# Patient Record
Sex: Female | Born: 1950 | Race: Black or African American | Hispanic: No | Marital: Married | Smoking: Never smoker
Health system: Southern US, Community
[De-identification: ages and names within clinical notes are randomized; demographics above are authoritative.]

## PROBLEM LIST (undated history)

## (undated) DIAGNOSIS — C801 Malignant (primary) neoplasm, unspecified: Secondary | ICD-10-CM

## (undated) DIAGNOSIS — I10 Essential (primary) hypertension: Secondary | ICD-10-CM

## (undated) DIAGNOSIS — Z923 Personal history of irradiation: Secondary | ICD-10-CM

## (undated) HISTORY — DX: Essential (primary) hypertension: I10

## (undated) HISTORY — PX: BREAST LUMPECTOMY: SHX2

---

## 2010-11-09 DIAGNOSIS — Z923 Personal history of irradiation: Secondary | ICD-10-CM

## 2010-11-09 DIAGNOSIS — C801 Malignant (primary) neoplasm, unspecified: Secondary | ICD-10-CM

## 2010-11-09 HISTORY — PX: BREAST BIOPSY: SHX20

## 2010-11-09 HISTORY — DX: Malignant (primary) neoplasm, unspecified: C80.1

## 2010-11-09 HISTORY — DX: Personal history of irradiation: Z92.3

## 2010-12-31 ENCOUNTER — Ambulatory Visit: Payer: Self-pay | Admitting: Internal Medicine

## 2011-01-12 ENCOUNTER — Ambulatory Visit: Payer: Self-pay | Admitting: Internal Medicine

## 2011-01-29 ENCOUNTER — Ambulatory Visit: Payer: Self-pay | Admitting: Emergency Medicine

## 2011-01-30 ENCOUNTER — Ambulatory Visit: Payer: Self-pay | Admitting: Emergency Medicine

## 2011-02-09 ENCOUNTER — Ambulatory Visit: Payer: Self-pay | Admitting: Oncology

## 2011-02-16 LAB — PATHOLOGY REPORT

## 2011-03-10 ENCOUNTER — Ambulatory Visit: Payer: Self-pay | Admitting: Oncology

## 2011-03-27 ENCOUNTER — Ambulatory Visit: Payer: Self-pay | Admitting: Emergency Medicine

## 2011-04-10 ENCOUNTER — Ambulatory Visit: Payer: Self-pay | Admitting: Oncology

## 2011-05-10 ENCOUNTER — Ambulatory Visit: Payer: Self-pay | Admitting: Oncology

## 2011-06-10 ENCOUNTER — Ambulatory Visit: Payer: Self-pay | Admitting: Oncology

## 2011-09-17 ENCOUNTER — Ambulatory Visit: Payer: Self-pay | Admitting: Oncology

## 2011-10-10 ENCOUNTER — Ambulatory Visit: Payer: Self-pay | Admitting: Oncology

## 2011-11-10 ENCOUNTER — Ambulatory Visit: Payer: Self-pay | Admitting: Oncology

## 2012-04-19 ENCOUNTER — Ambulatory Visit: Payer: Self-pay | Admitting: Oncology

## 2012-04-19 LAB — COMPREHENSIVE METABOLIC PANEL
Albumin: 3.9 g/dL (ref 3.4–5.0)
Alkaline Phosphatase: 64 U/L (ref 50–136)
Anion Gap: 9 (ref 7–16)
BUN: 12 mg/dL (ref 7–18)
Bilirubin,Total: 0.2 mg/dL (ref 0.2–1.0)
Calcium, Total: 9.1 mg/dL (ref 8.5–10.1)
Chloride: 100 mmol/L (ref 98–107)
Co2: 30 mmol/L (ref 21–32)
Creatinine: 0.94 mg/dL (ref 0.60–1.30)
EGFR (African American): >60
EGFR (Non-African Amer.): >60
Glucose: 129 mg/dL — ABNORMAL HIGH (ref 65–99)
Osmolality: 279 (ref 275–301)
Potassium: 3.4 mmol/L — ABNORMAL LOW (ref 3.5–5.1)
SGOT(AST): 16 U/L (ref 15–37)
SGPT (ALT): 28 U/L
Sodium: 139 mmol/L (ref 136–145)
Total Protein: 8.9 g/dL — ABNORMAL HIGH (ref 6.4–8.2)

## 2012-04-19 LAB — CBC CANCER CENTER
Basophil #: 0 x10 3/mm (ref 0.0–0.1)
Basophil %: 0.3 %
Eosinophil #: 0.1 x10 3/mm (ref 0.0–0.7)
Eosinophil %: 0.9 %
HCT: 39 % (ref 35.0–47.0)
HGB: 12.3 g/dL (ref 12.0–16.0)
Lymphocyte #: 2 x10 3/mm (ref 1.0–3.6)
Lymphocyte %: 31.4 %
MCH: 26.2 pg (ref 26.0–34.0)
MCHC: 31.7 g/dL — ABNORMAL LOW (ref 32.0–36.0)
MCV: 83 fL (ref 80–100)
Monocyte #: 1 x10 3/mm — ABNORMAL HIGH (ref 0.2–0.9)
Monocyte %: 16.4 %
Neutrophil #: 3.2 x10 3/mm (ref 1.4–6.5)
Neutrophil %: 51 %
Platelet: 223 x10 3/mm (ref 150–440)
RBC: 4.71 10*6/uL (ref 3.80–5.20)
RDW: 14 % (ref 11.5–14.5)
WBC: 6.3 x10 3/mm (ref 3.6–11.0)

## 2012-05-09 ENCOUNTER — Ambulatory Visit: Payer: Self-pay | Admitting: Oncology

## 2012-10-18 ENCOUNTER — Ambulatory Visit: Payer: Self-pay | Admitting: Oncology

## 2012-10-18 LAB — COMPREHENSIVE METABOLIC PANEL
Albumin: 3.8 g/dL (ref 3.4–5.0)
Alkaline Phosphatase: 59 U/L (ref 50–136)
Anion Gap: 10 (ref 7–16)
BUN: 14 mg/dL (ref 7–18)
Bilirubin,Total: 0.2 mg/dL (ref 0.2–1.0)
Calcium, Total: 9.1 mg/dL (ref 8.5–10.1)
Chloride: 106 mmol/L (ref 98–107)
Co2: 27 mmol/L (ref 21–32)
Creatinine: 1.05 mg/dL (ref 0.60–1.30)
EGFR (African American): >60
EGFR (Non-African Amer.): 57 — ABNORMAL LOW
Glucose: 89 mg/dL (ref 65–99)
Osmolality: 285 (ref 275–301)
Potassium: 3.6 mmol/L (ref 3.5–5.1)
SGOT(AST): 17 U/L (ref 15–37)
SGPT (ALT): 28 U/L (ref 12–78)
Sodium: 143 mmol/L (ref 136–145)
Total Protein: 8.8 g/dL — ABNORMAL HIGH (ref 6.4–8.2)

## 2012-10-18 LAB — CBC CANCER CENTER
Basophil #: 0 x10 3/mm (ref 0.0–0.1)
Basophil %: 0.5 %
Eosinophil #: 0.1 x10 3/mm (ref 0.0–0.7)
Eosinophil %: 0.9 %
HCT: 38 % (ref 35.0–47.0)
HGB: 12.5 g/dL (ref 12.0–16.0)
Lymphocyte #: 2 x10 3/mm (ref 1.0–3.6)
Lymphocyte %: 34.9 %
MCH: 27 pg (ref 26.0–34.0)
MCHC: 33 g/dL (ref 32.0–36.0)
MCV: 82 fL (ref 80–100)
Monocyte #: 1 x10 3/mm — ABNORMAL HIGH (ref 0.2–0.9)
Monocyte %: 17.6 %
Neutrophil #: 2.6 x10 3/mm (ref 1.4–6.5)
Neutrophil %: 46.1 %
Platelet: 202 x10 3/mm (ref 150–440)
RBC: 4.65 10*6/uL (ref 3.80–5.20)
RDW: 13.5 % (ref 11.5–14.5)
WBC: 5.6 x10 3/mm (ref 3.6–11.0)

## 2012-11-09 ENCOUNTER — Ambulatory Visit: Payer: Self-pay | Admitting: Oncology

## 2013-04-09 ENCOUNTER — Ambulatory Visit: Payer: Self-pay | Admitting: Oncology

## 2013-04-13 ENCOUNTER — Ambulatory Visit: Payer: Self-pay | Admitting: Oncology

## 2013-04-18 LAB — COMPREHENSIVE METABOLIC PANEL
Albumin: 3.9 g/dL (ref 3.4–5.0)
Alkaline Phosphatase: 70 U/L (ref 50–136)
Anion Gap: 7 (ref 7–16)
BUN: 15 mg/dL (ref 7–18)
Bilirubin,Total: 0.2 mg/dL (ref 0.2–1.0)
Calcium, Total: 9.3 mg/dL (ref 8.5–10.1)
Chloride: 103 mmol/L (ref 98–107)
Co2: 30 mmol/L (ref 21–32)
Creatinine: 1.15 mg/dL (ref 0.60–1.30)
EGFR (African American): 59 — ABNORMAL LOW
EGFR (Non-African Amer.): 51 — ABNORMAL LOW
Glucose: 108 mg/dL — ABNORMAL HIGH (ref 65–99)
Osmolality: 281 (ref 275–301)
Potassium: 3.8 mmol/L (ref 3.5–5.1)
SGOT(AST): 14 U/L — ABNORMAL LOW (ref 15–37)
SGPT (ALT): 25 U/L (ref 12–78)
Sodium: 140 mmol/L (ref 136–145)
Total Protein: 8.9 g/dL — ABNORMAL HIGH (ref 6.4–8.2)

## 2013-04-18 LAB — CBC CANCER CENTER
Basophil #: 0.1 x10 3/mm (ref 0.0–0.1)
Basophil %: 0.9 %
Eosinophil #: 0 x10 3/mm (ref 0.0–0.7)
Eosinophil %: 0.5 %
HCT: 39.2 % (ref 35.0–47.0)
HGB: 13 g/dL (ref 12.0–16.0)
Lymphocyte #: 2 x10 3/mm (ref 1.0–3.6)
Lymphocyte %: 28.6 %
MCH: 27.1 pg (ref 26.0–34.0)
MCHC: 33.1 g/dL (ref 32.0–36.0)
MCV: 82 fL (ref 80–100)
Monocyte #: 0.9 x10 3/mm (ref 0.2–0.9)
Monocyte %: 13.4 %
Neutrophil #: 4 x10 3/mm (ref 1.4–6.5)
Neutrophil %: 56.6 %
Platelet: 218 x10 3/mm (ref 150–440)
RBC: 4.8 10*6/uL (ref 3.80–5.20)
RDW: 13.5 % (ref 11.5–14.5)
WBC: 7 x10 3/mm (ref 3.6–11.0)

## 2013-05-09 ENCOUNTER — Ambulatory Visit: Payer: Self-pay | Admitting: Oncology

## 2013-11-27 ENCOUNTER — Ambulatory Visit: Payer: Self-pay | Admitting: Oncology

## 2014-12-25 ENCOUNTER — Ambulatory Visit: Payer: Self-pay | Admitting: Oncology

## 2017-08-10 ENCOUNTER — Other Ambulatory Visit: Payer: Self-pay | Admitting: Internal Medicine

## 2017-08-10 DIAGNOSIS — C50012 Malignant neoplasm of nipple and areola, left female breast: Secondary | ICD-10-CM

## 2018-08-10 ENCOUNTER — Other Ambulatory Visit: Payer: Self-pay | Admitting: Family Medicine

## 2018-08-10 DIAGNOSIS — Z0000 Encounter for general adult medical examination without abnormal findings: Secondary | ICD-10-CM

## 2018-12-21 ENCOUNTER — Other Ambulatory Visit: Payer: Self-pay | Admitting: Family Medicine

## 2018-12-21 DIAGNOSIS — Z1231 Encounter for screening mammogram for malignant neoplasm of breast: Secondary | ICD-10-CM

## 2018-12-23 ENCOUNTER — Other Ambulatory Visit: Payer: Self-pay | Admitting: Family Medicine

## 2018-12-23 DIAGNOSIS — Z13820 Encounter for screening for osteoporosis: Secondary | ICD-10-CM

## 2019-01-17 ENCOUNTER — Encounter (HOSPITAL_COMMUNITY): Payer: Self-pay

## 2019-01-17 ENCOUNTER — Ambulatory Visit
Admission: RE | Admit: 2019-01-17 | Discharge: 2019-01-17 | Disposition: A | Discharge status: Home / Self Care | Payer: Medicare HMO | Source: Ambulatory Visit | Attending: Family Medicine | Admitting: Family Medicine

## 2019-01-17 DIAGNOSIS — Z13820 Encounter for screening for osteoporosis: Secondary | ICD-10-CM

## 2019-01-17 DIAGNOSIS — Z1231 Encounter for screening mammogram for malignant neoplasm of breast: Secondary | ICD-10-CM | POA: Diagnosis present

## 2019-01-17 HISTORY — DX: Malignant (primary) neoplasm, unspecified: C80.1

## 2019-01-17 HISTORY — DX: Personal history of irradiation: Z92.3

## 2019-12-18 ENCOUNTER — Other Ambulatory Visit: Payer: Self-pay | Admitting: Family Medicine

## 2019-12-18 DIAGNOSIS — Z1231 Encounter for screening mammogram for malignant neoplasm of breast: Secondary | ICD-10-CM

## 2020-02-21 ENCOUNTER — Ambulatory Visit
Admission: RE | Admit: 2020-02-21 | Discharge: 2020-02-21 | Disposition: A | Discharge status: Home / Self Care | Payer: Medicare HMO | Source: Ambulatory Visit | Attending: Family Medicine | Admitting: Family Medicine

## 2020-02-21 DIAGNOSIS — Z1231 Encounter for screening mammogram for malignant neoplasm of breast: Secondary | ICD-10-CM | POA: Insufficient documentation

## 2020-02-27 ENCOUNTER — Other Ambulatory Visit: Payer: Self-pay | Admitting: Family Medicine

## 2020-02-27 DIAGNOSIS — R921 Mammographic calcification found on diagnostic imaging of breast: Secondary | ICD-10-CM

## 2020-02-27 DIAGNOSIS — R928 Other abnormal and inconclusive findings on diagnostic imaging of breast: Secondary | ICD-10-CM

## 2020-03-08 ENCOUNTER — Ambulatory Visit
Admission: RE | Admit: 2020-03-08 | Discharge: 2020-03-08 | Disposition: A | Discharge status: Home / Self Care | Payer: Medicare HMO | Source: Ambulatory Visit | Attending: Family Medicine | Admitting: Family Medicine

## 2020-03-08 DIAGNOSIS — R921 Mammographic calcification found on diagnostic imaging of breast: Secondary | ICD-10-CM | POA: Diagnosis present

## 2020-03-08 DIAGNOSIS — R928 Other abnormal and inconclusive findings on diagnostic imaging of breast: Secondary | ICD-10-CM | POA: Insufficient documentation

## 2020-03-11 ENCOUNTER — Other Ambulatory Visit: Payer: Self-pay | Admitting: Family Medicine

## 2020-03-26 ENCOUNTER — Other Ambulatory Visit: Payer: Self-pay | Admitting: Family Medicine

## 2020-03-26 DIAGNOSIS — R921 Mammographic calcification found on diagnostic imaging of breast: Secondary | ICD-10-CM

## 2020-03-26 DIAGNOSIS — R928 Other abnormal and inconclusive findings on diagnostic imaging of breast: Secondary | ICD-10-CM

## 2020-03-29 ENCOUNTER — Ambulatory Visit
Admission: RE | Admit: 2020-03-29 | Discharge: 2020-03-29 | Disposition: A | Discharge status: Home / Self Care | Payer: Medicare HMO | Source: Ambulatory Visit | Attending: Family Medicine | Admitting: Family Medicine

## 2020-03-29 DIAGNOSIS — R921 Mammographic calcification found on diagnostic imaging of breast: Secondary | ICD-10-CM | POA: Diagnosis present

## 2020-03-29 DIAGNOSIS — R928 Other abnormal and inconclusive findings on diagnostic imaging of breast: Secondary | ICD-10-CM | POA: Diagnosis not present

## 2020-03-29 HISTORY — PX: BREAST BIOPSY: SHX20

## 2020-04-01 LAB — SURGICAL PATHOLOGY

## 2020-04-02 ENCOUNTER — Other Ambulatory Visit: Payer: Self-pay | Admitting: Family Medicine

## 2020-04-02 DIAGNOSIS — M5416 Radiculopathy, lumbar region: Secondary | ICD-10-CM

## 2020-04-16 ENCOUNTER — Other Ambulatory Visit: Payer: Self-pay

## 2020-04-16 ENCOUNTER — Ambulatory Visit
Admission: RE | Admit: 2020-04-16 | Discharge: 2020-04-16 | Disposition: A | Discharge status: Home / Self Care | Payer: Medicare HMO | Source: Ambulatory Visit | Attending: Family Medicine | Admitting: Family Medicine

## 2020-04-16 DIAGNOSIS — M5416 Radiculopathy, lumbar region: Secondary | ICD-10-CM | POA: Diagnosis present

## 2020-10-18 IMAGING — MR MR LUMBAR SPINE W/O CM
5 series · 31 of 48 positions shown · non-contrast
Comparison: None.

CLINICAL DATA: Bilateral hip pain.

EXAM:
MRI LUMBAR SPINE WITHOUT CONTRAST
TECHNIQUE: Multiplanar, multisequence MR imaging of the lumbar spine was
performed. No intravenous contrast was administered.

[Series 5: T2 · sagittal · 4.0mm · 0.81mm/px · 6 of 17 slices shown (1 of 2)]
[im 1/17]
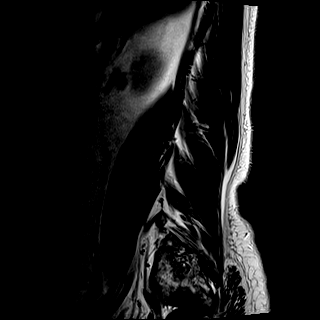
[im 4/17]
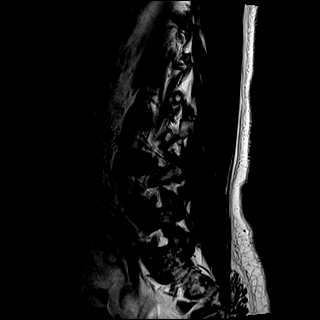
[im 7/17]
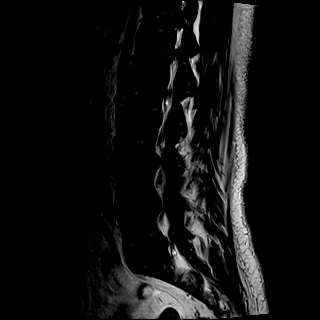
[im 10/17]
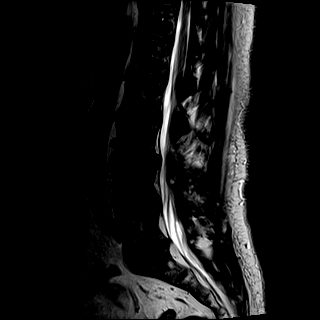
[im 13/17]
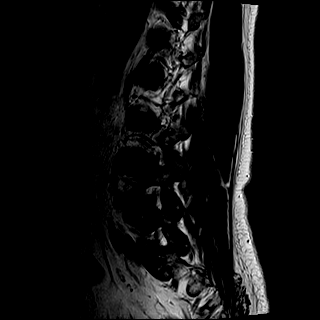
[im 17/17]
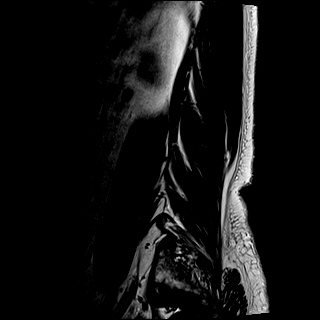

[Series 6: T1 · sagittal · 4.0mm · 0.81mm/px · 6 of 17 slices shown (1 of 2)]
[im 1/17]
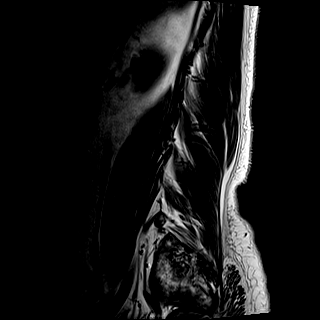
[im 4/17]
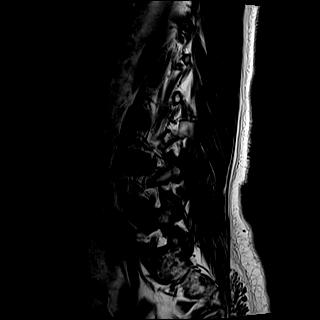
[im 7/17]
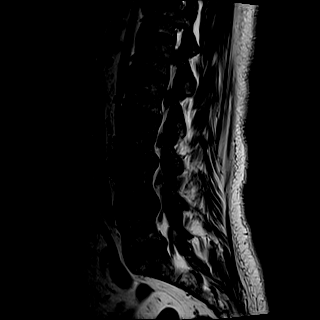
[im 10/17]
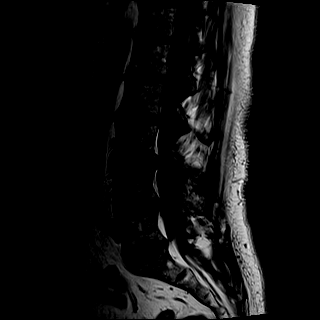
[im 13/17]
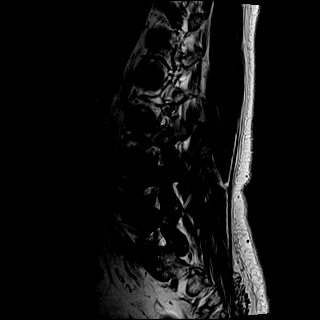
[im 17/17]
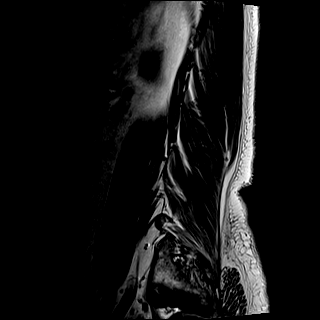

[Series 7: STIR · sagittal · 4.0mm · 0.41mm/px · 1 of 17 slices shown]
[im 1/17]
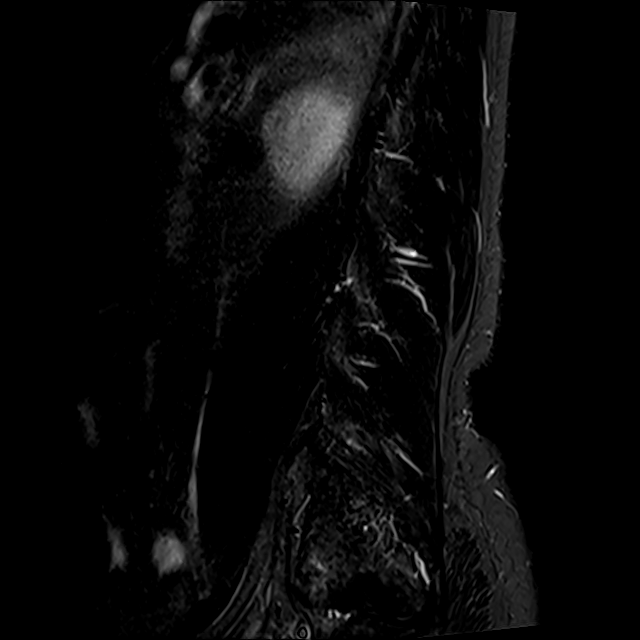

[Series 8: T2 · axial · 4.0mm · 0.78mm/px · z∈[-74,+147]mm · 9 of 39 slices shown (2 of 2)]
[im 1/39]
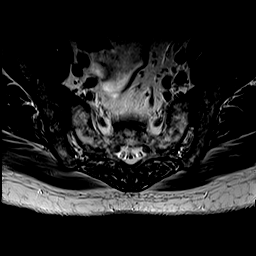
[im 6/39]
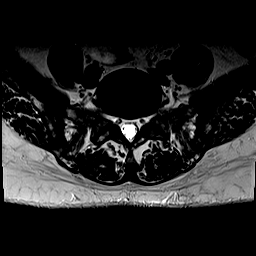
[im 11/39]
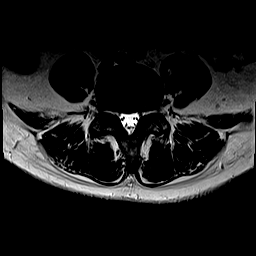
[im 17/39]
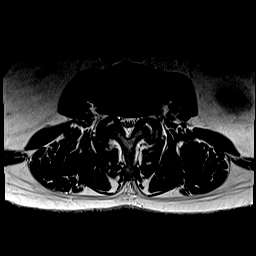
[im 20/39]
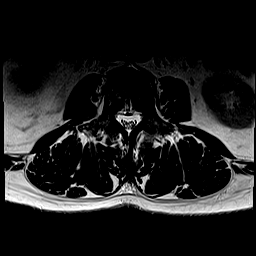
[im 22/39]
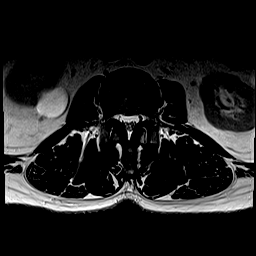
[im 28/39]
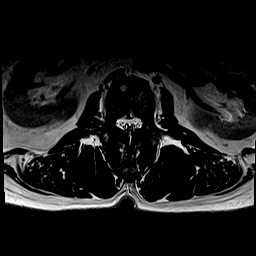
[im 33/39]
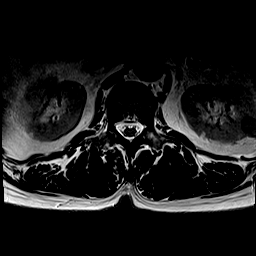
[im 39/39]
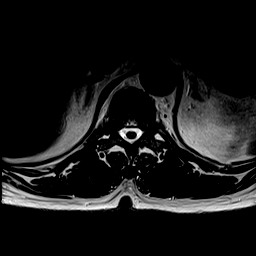

[Series 9: T1 · axial · 4.0mm · 0.39mm/px · z∈[-74,+147]mm · 9 of 39 slices shown (2 of 2)]
[im 1/39]
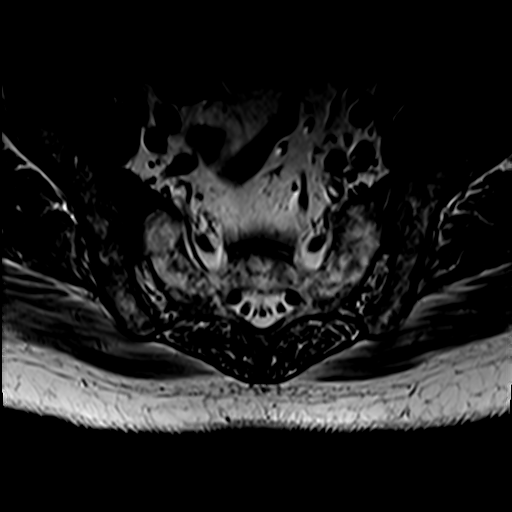
[im 6/39]
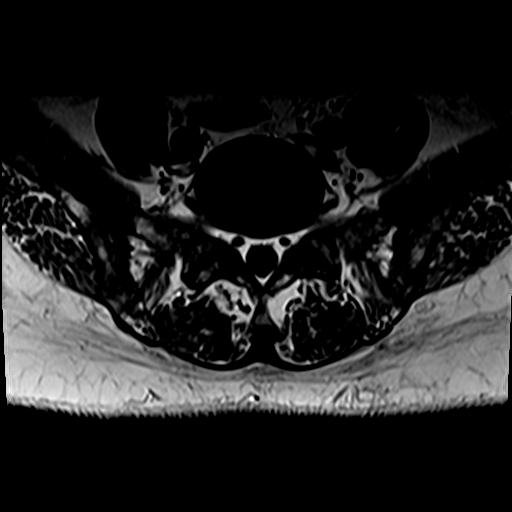
[im 11/39]
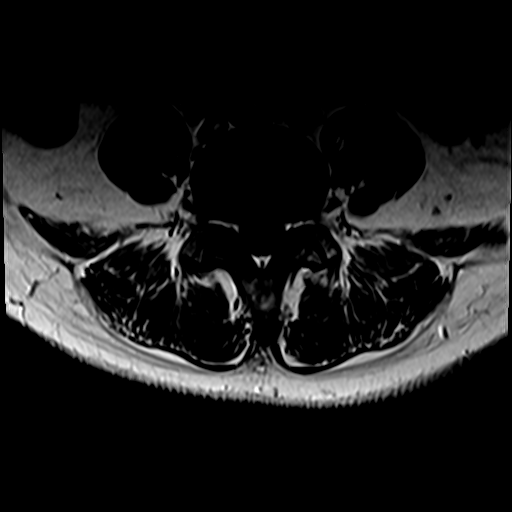
[im 17/39]
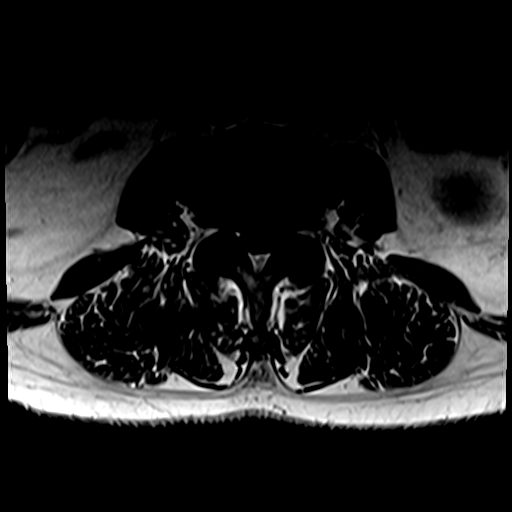
[im 20/39]
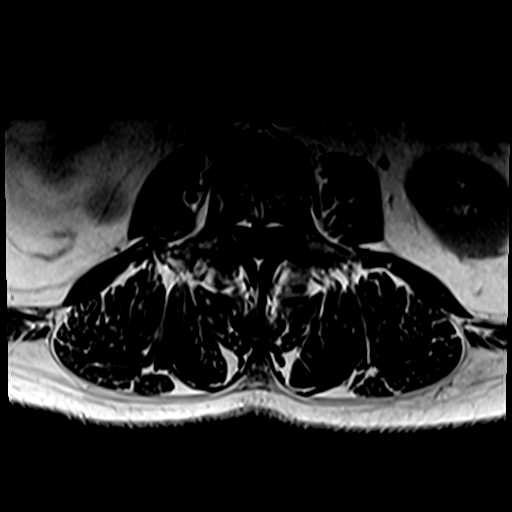
[im 22/39]
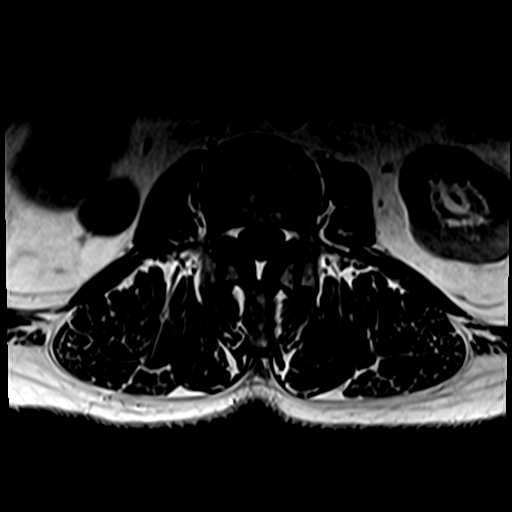
[im 28/39]
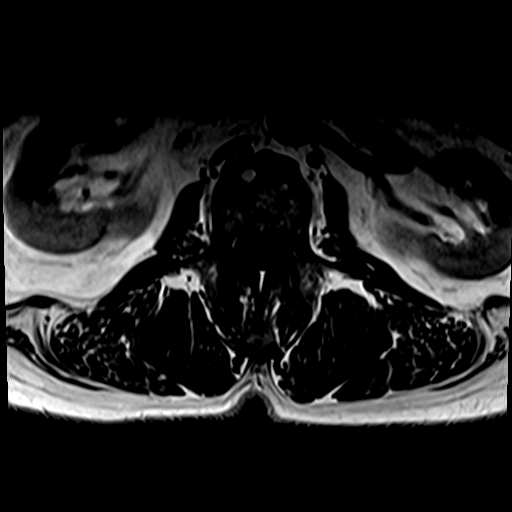
[im 33/39]
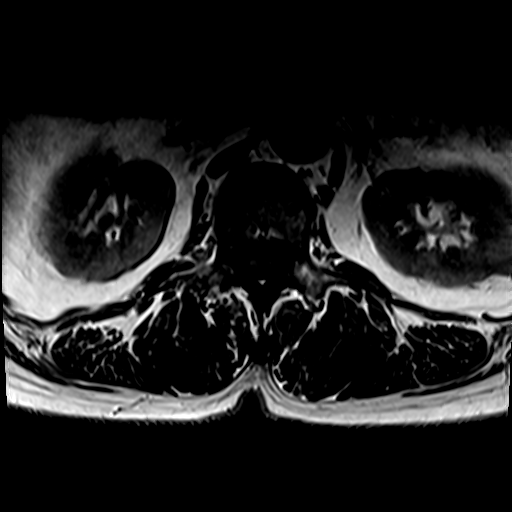
[im 39/39]
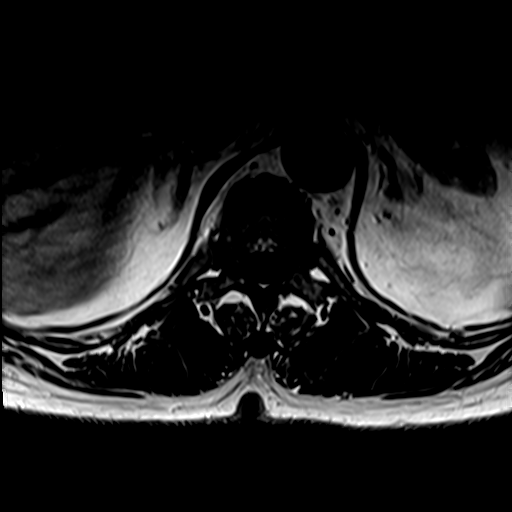

[31 of 48 positions shown; findings below may reference images not displayed]

FINDINGS: Segmentation:  Standard.

Alignment:  Physiologic.

Vertebrae: No fracture, evidence of discitis, or bone lesion.
Diffuse heterogeneous signal within the visualized bony structures,
predominantly hypointense on T1 without increased signal on STIR,
likely related to red marrow reconversion. Marrow edema is seen
about the posterior elements at L3-4 on the left, degenerative.

Conus medullaris and cauda equina: Conus extends to the L1-2 level.
Conus and cauda equina appear normal.

Paraspinal and other soft tissues: Bilateral renal cysts with a T2
hypointense lesion only partially seen on the axial (series 8, image
8). Correlation with dedicated study suggested.

Disc levels:

T12-L1: No spinal canal or neural foraminal stenosis.

L1-2: No spinal canal or neural foraminal stenosis.

L2-3: No spinal canal or neural foraminal stenosis.

L3-4: Loss of disc height, shallow disc bulge, facet degenerative
changes and ligamentum flavum redundancy resulting in mild bilateral
narrowing of the subarticular zones and mild bilateral neural
foraminal stenosis.

L4-5: Shallow disc bulge, mild facet degenerative changes and
ligamentum flavum redundancy resulting in mild narrowing of the
bilateral subarticular zones and mild bilateral neural foraminal
narrowing.

L5-S1: Tiny posterior disc protrusion mild facet degenerative
changes without significant spinal canal or neural foraminal
stenosis
IMPRESSION: 1. Mild degenerative changes of the lower lumbar spine with mild
bilateral neural foraminal narrowing and mild narrowing of the
bilateral subarticular zones at L3-4 and L4-5. No high-grade spinal
canal or neural foraminal narrowing.
2. Diffuse heterogeneous signal within the visualized bony
structures, predominantly hypointense on T1 without increased signal
on STIR, likely related to red marrow reconversion.
3. Marrow edema is seen about the posterior elements at L3-4 on the
left, degenerative.

## 2021-04-16 ENCOUNTER — Other Ambulatory Visit: Payer: Self-pay | Admitting: *Deleted

## 2021-04-16 ENCOUNTER — Other Ambulatory Visit: Payer: Self-pay | Admitting: Physician Assistant

## 2021-04-16 DIAGNOSIS — Z1231 Encounter for screening mammogram for malignant neoplasm of breast: Secondary | ICD-10-CM

## 2021-04-23 ENCOUNTER — Ambulatory Visit
Admission: RE | Admit: 2021-04-23 | Discharge: 2021-04-23 | Disposition: A | Discharge status: Home / Self Care | Payer: Medicare HMO | Source: Ambulatory Visit | Attending: Physician Assistant | Admitting: Physician Assistant

## 2021-04-23 ENCOUNTER — Other Ambulatory Visit: Payer: Self-pay

## 2021-04-23 DIAGNOSIS — Z1231 Encounter for screening mammogram for malignant neoplasm of breast: Secondary | ICD-10-CM | POA: Diagnosis present

## 2022-01-19 ENCOUNTER — Other Ambulatory Visit: Payer: Self-pay | Admitting: Physician Assistant

## 2022-01-19 DIAGNOSIS — Z13820 Encounter for screening for osteoporosis: Secondary | ICD-10-CM

## 2022-01-19 DIAGNOSIS — Z1231 Encounter for screening mammogram for malignant neoplasm of breast: Secondary | ICD-10-CM

## 2022-05-21 ENCOUNTER — Ambulatory Visit
Admission: RE | Admit: 2022-05-21 | Discharge: 2022-05-21 | Disposition: A | Discharge status: Home / Self Care | Payer: Medicare HMO | Source: Ambulatory Visit | Attending: Physician Assistant | Admitting: Physician Assistant

## 2022-05-21 DIAGNOSIS — Z923 Personal history of irradiation: Secondary | ICD-10-CM | POA: Diagnosis not present

## 2022-05-21 DIAGNOSIS — Z853 Personal history of malignant neoplasm of breast: Secondary | ICD-10-CM | POA: Diagnosis not present

## 2022-05-21 DIAGNOSIS — Z1231 Encounter for screening mammogram for malignant neoplasm of breast: Secondary | ICD-10-CM | POA: Insufficient documentation

## 2022-05-21 DIAGNOSIS — Z780 Asymptomatic menopausal state: Secondary | ICD-10-CM | POA: Insufficient documentation

## 2022-05-21 DIAGNOSIS — Z13820 Encounter for screening for osteoporosis: Secondary | ICD-10-CM | POA: Diagnosis present

## 2023-04-13 ENCOUNTER — Other Ambulatory Visit: Payer: Self-pay | Admitting: Nurse Practitioner

## 2023-04-13 DIAGNOSIS — Z1231 Encounter for screening mammogram for malignant neoplasm of breast: Secondary | ICD-10-CM

## 2023-05-24 ENCOUNTER — Ambulatory Visit
Admission: RE | Admit: 2023-05-24 | Discharge: 2023-05-24 | Disposition: A | Discharge status: Home / Self Care | Payer: Medicare HMO | Source: Ambulatory Visit | Attending: Nurse Practitioner | Admitting: Nurse Practitioner

## 2023-05-24 DIAGNOSIS — Z1231 Encounter for screening mammogram for malignant neoplasm of breast: Secondary | ICD-10-CM | POA: Insufficient documentation

## 2023-05-24 NOTE — Progress Notes (Signed)
Celso Amy, PA-C [address]  [address]  [place], [place] [address]  Main: [phone]  Fax: [phone]   Gastroenterology Consultation  Referring Provider:     Dudley Major, FNP Primary Care Physician:  Dudley Major, FNP Primary Gastroenterologist:  Celso Amy, PA-C / Dr. Lannette Donath   Reason for Consultation:     Positive Cologuard        HPI:   Ariana Potter is a 72 y.o. y/o female referred for consultation & management  by Dudley Major, FNP.    Patient had annual wellness exam 04/12/2023.  She had a positive screening Cologuard test.  Patient states she had a normal colonoscopy around 2012.  Result is unavailable.  She has family history of her oldest brother who had colon cancer in his late 36s.  GI symptoms: She has mild occasional constipation and has taken OTC MiraLAX in the past.  Currently having bowel movement daily.  She denies rectal bleeding, abdominal pain, unintentional weight loss, or any other GI symptoms.  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 Authorizing Provider  amLODipine (NORVASC) 2.5 MG tablet Take 2.5 mg by mouth daily.   Yes [provider]  Ascorbic Acid (VITAMIN C) 100 MG tablet Take 100 mg by mouth daily.   Yes [provider]  cholecalciferol (VITAMIN D3) 25 MCG (1000 UNIT) tablet Take 1,000 Units by mouth daily.   Yes [provider]  hydrochlorothiazide (HYDRODIURIL) 25 MG tablet Take 25 mg by mouth daily.   Yes [provider]  losartan (COZAAR)  25 MG tablet Take 25 mg by mouth daily.   Yes [provider]  polyethylene glycol-electrolytes (NULYTELY) 420 g solution Take 4,000 mLs by mouth once for 1 dose. Use as directed for your colonoscopy preparation 05/25/23 05/25/23 Yes Celso Amy, PA-C     Family History  Problem Relation Age of Onset   Breast cancer Mother 36     Social History   Tobacco Use   Smoking status: Never   Smokeless tobacco: Never  Substance Use Topics   Alcohol use: Never   Drug use: Never    Allergies as of 05/25/2023   (Not on File)    Review of Systems:    All systems reviewed and negative except where noted in HPI.   Physical Exam:  BP 138/85   Pulse 96   Temp 99 F (37.2 C)   Ht 5\' 2"  (1.575 m)   Wt 175 lb 12.8 oz (79.7 kg)   BMI 32.15 kg/m  No LMP recorded. Patient is postmenopausal. Psych:  Alert and cooperative. Normal mood and affect. General:   Alert,  Well-developed, well-nourished, pleasant and cooperative in NAD Head:  Normocephalic and atraumatic. Eyes:  Sclera clear, no icterus.   Conjunctiva pink. Neck:  Supple; no masses or thyromegaly. Lungs:  Respirations even and unlabored.  Clear throughout to auscultation.   No wheezes, crackles, or rhonchi. No acute distress. Heart:  Regular  rate and rhythm; no murmurs, clicks, rubs, or gallops. Abdomen:  Normal bowel sounds.  No bruits.  Soft, and non-distended without masses, hepatosplenomegaly or hernias noted.  No Tenderness.  No guarding or rebound tenderness.    Neurologic:  Alert and oriented x3;  grossly normal neurologically. Psych:  Alert and cooperative. Normal mood and affect.  Imaging Studies: No results found.  Assessment and Plan:   Ariana Potter is a 72 y.o. y/o female has been referred for   1.  Positive Cologuard test  Scheduling Colonoscopy I discussed risks of colonoscopy with patient to include risk of bleeding, colon perforation, and risk of sedation.  Patient expressed understanding and  agrees to proceed with colonoscopy.   2.  Family history of colon cancer in her brother (age 49s).  I instructed patient she needs colonoscopy every 5 years due to her family history.  3.  Mild constipation  Recommend she take OTC MiraLAX, 1 capful in a drink once daily.  Recommend high-fiber diet and 64 ounces of fluids daily.  Follow up as needed based on procedure results and GI symptoms.  Celso Amy, PA-C

## 2023-05-25 ENCOUNTER — Ambulatory Visit: Payer: Medicare HMO | Admitting: Physician Assistant

## 2023-05-25 ENCOUNTER — Encounter: Payer: Self-pay | Admitting: Physician Assistant

## 2023-05-25 VITALS — BP 138/85 | HR 96 | Temp 99.0°F | Ht 62.0 in | Wt 175.8 lb

## 2023-05-25 DIAGNOSIS — R195 Other fecal abnormalities: Secondary | ICD-10-CM | POA: Diagnosis not present

## 2023-05-25 DIAGNOSIS — I10 Essential (primary) hypertension: Secondary | ICD-10-CM | POA: Diagnosis not present

## 2023-05-25 MED ORDER — PEG 3350-KCL-NA BICARB-NACL 420 G PO SOLR: 4000.0000 mL | Freq: Once | ORAL | 0 refills | Status: AC

## 2023-05-25 NOTE — Patient Instructions (Addendum)
Please take OTC MiraLAX, mix 1 capful in a drink once daily as needed for constipation. Try to drink 64 ounces of fluids daily. Eat high-fiber diet with fresh fruits and vegetables.

## 2023-07-13 ENCOUNTER — Ambulatory Visit
Admission: RE | Admit: 2023-07-13 | Discharge: 2023-07-13 | Disposition: A | Discharge status: Home / Self Care | Payer: Medicare HMO | Attending: Gastroenterology | Admitting: Gastroenterology

## 2023-07-13 ENCOUNTER — Ambulatory Visit: Payer: Medicare HMO | Admitting: Certified Registered"

## 2023-07-13 ENCOUNTER — Encounter: Payer: Self-pay | Admitting: Gastroenterology

## 2023-07-13 ENCOUNTER — Encounter
Admission: RE | Disposition: A | Discharge status: Home / Self Care | Payer: Self-pay | Source: Home / Self Care | Attending: Gastroenterology

## 2023-07-13 DIAGNOSIS — I10 Essential (primary) hypertension: Secondary | ICD-10-CM | POA: Insufficient documentation

## 2023-07-13 DIAGNOSIS — R195 Other fecal abnormalities: Secondary | ICD-10-CM

## 2023-07-13 DIAGNOSIS — Z1211 Encounter for screening for malignant neoplasm of colon: Secondary | ICD-10-CM | POA: Diagnosis present

## 2023-07-13 HISTORY — PX: COLONOSCOPY WITH PROPOFOL: SHX5780

## 2023-07-13 SURGERY — COLONOSCOPY WITH PROPOFOL
Anesthesia: General

## 2023-07-13 MED ORDER — SODIUM CHLORIDE 0.9 % IV SOLN: INTRAVENOUS | Status: DC | PRN

## 2023-07-13 MED ORDER — SODIUM CHLORIDE 0.9 % IV SOLN
INTRAVENOUS | Status: DC
  Administered 2023-07-13: 1000 mL via INTRAVENOUS

## 2023-07-13 MED ORDER — PROPOFOL 1000 MG/100ML IV EMUL
INTRAVENOUS | Status: AC
  Filled 2023-07-13: qty 200

## 2023-07-13 MED ORDER — PROPOFOL 500 MG/50ML IV EMUL
INTRAVENOUS | Status: DC | PRN
  Administered 2023-07-13: 40 mg via INTRAVENOUS
  Administered 2023-07-13: 150 ug/kg/min via INTRAVENOUS

## 2023-07-13 MED ORDER — PROPOFOL 10 MG/ML IV BOLUS
INTRAVENOUS | Status: AC
  Filled 2023-07-13: qty 40

## 2023-07-13 MED ORDER — ONDANSETRON HCL 4 MG/2ML IJ SOLN
INTRAMUSCULAR | Status: DC | PRN
  Administered 2023-07-13: 4 mg via INTRAVENOUS

## 2023-07-13 NOTE — Op Note (Signed)
[facility] Gastroenterology Patient Name: Ariana Potter Procedure Date: 07/13/2023 8:38 AM MRN: [id number] Account #: [id number] Date of Birth: [birth year] Admit Type: Outpatient Age: 72 Room: [facility] ENDO ROOM 3 Gender: Female Note Status: Finalized Instrument Name: Prentice Docker [id number] Procedure:             Colonoscopy Indications:           Positive Cologuard test Providers:             Toney Reil MD, MD Referring MD:          No Local Md, MD (Referring MD) Medicines:             General Anesthesia Complications:         No immediate complications. Estimated blood loss: None. Procedure:             Pre-Anesthesia Assessment:                        - Prior to the procedure, a History and Physical was                         performed, and patient medications and allergies were                         reviewed. The patient is competent. The risks and                         benefits of the procedure and the sedation options and                         risks were discussed with the patient. All questions                         were answered and informed consent was obtained.                         Patient identification and proposed procedure were                         verified by the physician, the nurse, the                         anesthesiologist, the anesthetist and the technician                         in the pre-procedure area in the procedure room in the                         endoscopy suite. Mental Status Examination: alert and                         oriented. Airway Examination: normal oropharyngeal                         airway and neck mobility. Respiratory Examination:                         clear to auscultation. CV Examination: normal.  Prophylactic Antibiotics: The patient does not require                         prophylactic antibiotics. Prior Anticoagulants: The                         patient has taken no  anticoagulant or antiplatelet                         agents. ASA Grade Assessment: II - A patient with mild                         systemic disease. After reviewing the risks and                         benefits, the patient was deemed in satisfactory                         condition to undergo the procedure. The anesthesia                         plan was to use general anesthesia. Immediately prior                         to administration of medications, the patient was                         re-assessed for adequacy to receive sedatives. The                         heart rate, respiratory rate, oxygen saturations,                         blood pressure, adequacy of pulmonary ventilation, and                         response to care were monitored throughout the                         procedure. The physical status of the patient was                         re-assessed after the procedure.                        After obtaining informed consent, the colonoscope was                         passed under direct vision. Throughout the procedure,                         the patient's blood pressure, pulse, and oxygen                         saturations were monitored continuously. The                         Colonoscope was introduced through the anus and  advanced to the the cecum, identified by appendiceal                         orifice and ileocecal valve. The colonoscopy was                         performed without difficulty. The patient tolerated                         the procedure well. The quality of the bowel                         preparation was evaluated using the BBPS [facility] Bowel                         Preparation Scale) with scores of: Right Colon = 3,                         Transverse Colon = 3 and Left Colon = 3 (entire mucosa                         seen well with no residual staining, small fragments                         of stool or  opaque liquid). The total BBPS score                         equals 9. The ileocecal valve, appendiceal orifice,                         and rectum were photographed. Findings:      The perianal and digital rectal examinations were normal. Pertinent       negatives include normal sphincter tone and no palpable rectal lesions.      The entire examined colon appeared normal.      The retroflexed view of the distal rectum and anal verge was normal and       showed no anal or rectal abnormalities. Impression:            - The entire examined colon is normal.                        - The distal rectum and anal verge are normal on                         retroflexion view.                        - No specimens collected. Recommendation:        - Discharge patient to home (with escort).                        - Resume previous diet today.                        - Continue present medications.                        - Repeat colonoscopy in  10 years for screening                         purposes. Procedure Code(s):     --- Professional ---                        [id number], Colonoscopy, flexible; diagnostic, including                         collection of specimen(s) by brushing or washing, when                         performed (separate procedure) Diagnosis Code(s):     --- Professional ---                        R19.5, Other fecal abnormalities CPT copyright 2022 American Medical Association. All rights reserved. The codes documented in this report are preliminary and upon coder review may  be revised to meet current compliance requirements. Dr. Libby Maw Toney Reil MD, MD 07/13/2023 8:58:21 AM This report has been signed electronically. Number of Addenda: 0 Note Initiated On: 07/13/2023 8:38 AM Scope Withdrawal Time: 0 hours 8 minutes 51 seconds  Total Procedure Duration: 0 hours 10 minutes 28 seconds  Estimated Blood Loss:  Estimated blood loss: none.      [facility]

## 2023-07-13 NOTE — Anesthesia Preprocedure Evaluation (Addendum)
Anesthesia Evaluation  Patient identified by MRN, date of birth, ID band Patient awake    Reviewed: Allergy & Precautions, NPO status , Patient's Chart, lab work & pertinent test results  History of Anesthesia Complications Negative for: history of anesthetic complications  Airway Mallampati: III  TM Distance: >3 FB Neck ROM: full    Dental  (+) Edentulous Upper, Edentulous Lower   Pulmonary neg pulmonary ROS   Pulmonary exam normal        Cardiovascular hypertension, On Medications Normal cardiovascular exam     Neuro/Psych negative neurological ROS  negative psych ROS   GI/Hepatic negative GI ROS, Neg liver ROS,,,  Endo/Other  negative endocrine ROS    Renal/GU negative Renal ROS  negative genitourinary   Musculoskeletal   Abdominal   Peds  Hematology negative hematology ROS (+)   Anesthesia Other Findings Past Medical History: 2012: Cancer (HCC)     Comment:  Left breast -DCIS No date: Hypertension 2012: Personal history of radiation therapy     Comment:  Left breast  Past Surgical History: 2012: BREAST BIOPSY; Left     Comment:  DCIS 03/29/2020: BREAST BIOPSY; Right     Comment:  coil clip,  stereo bx, fibroadnomatoid changes, NEGATIVE              FOR ATYPICAL PROLIFERATIVE No date: BREAST LUMPECTOMY; Left     Comment:  [id number] No date: CESAREAN SECTION     Comment:  x2  BMI    Body Mass Index: 32.22 kg/m      Reproductive/Obstetrics negative OB ROS                             Anesthesia Physical Anesthesia Plan  ASA: 2  Anesthesia Plan: General   Post-op Pain Management: Minimal or no pain anticipated   Induction: Intravenous  PONV Risk Score and Plan: 2 and Propofol infusion and TIVA  Airway Management Planned: Natural Airway and Nasal Cannula  Additional Equipment:   Intra-op Plan:   Post-operative Plan:   Informed Consent: I have reviewed the  patients History and Physical, chart, labs and discussed the procedure including the risks, benefits and alternatives for the proposed anesthesia with the patient or authorized representative who has indicated his/her understanding and acceptance.     Dental Advisory Given  Plan Discussed with: Anesthesiologist, CRNA and Surgeon  Anesthesia Plan Comments: (Patient consented for risks of anesthesia including but not limited to:  - adverse reactions to medications - risk of airway placement if required - damage to eyes, teeth, lips or other oral mucosa - nerve damage due to positioning  - sore throat or hoarseness - Damage to heart, brain, nerves, lungs, other parts of body or loss of life  Patient voiced understanding.)       Anesthesia Quick Evaluation

## 2023-07-13 NOTE — H&P (Signed)
Arlyss Repress, MD [address]  [address]  [place], [place] [address]  Main: [phone]  Fax: [phone] Pager: [phone]  Primary Care Physician:  Dudley Major, FNP Primary Gastroenterologist:  Dr. Arlyss Repress  Pre-Procedure History & Physical: HPI:  Ariana Potter is a 72 y.o. female is here for an colonoscopy.   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 Authorizing Provider  amLODipine (NORVASC) 2.5 MG tablet Take 2.5 mg by mouth daily.   Yes [provider]  Ascorbic Acid (VITAMIN C) 100 MG tablet Take 100 mg by mouth daily.   Yes [provider]  cholecalciferol (VITAMIN D3) 25 MCG (1000 UNIT) tablet Take 1,000 Units by mouth daily.   Yes [provider]  hydrochlorothiazide (HYDRODIURIL) 25 MG tablet Take 25 mg by mouth daily.   Yes [provider]  losartan (COZAAR) 25 MG tablet Take 25 mg by mouth daily.   Yes [provider]    Allergies as of 05/25/2023   (Not on File)    Family History  Problem Relation Age of Onset   Breast cancer Mother 4    Social History   Socioeconomic History   Marital status: Married    Spouse name: Not on file   Number of children: Not on file   Years of education: Not on file   Highest education level: Not on file  Occupational History   Not on file  Tobacco Use   Smoking status: Never   Smokeless tobacco: Never  Vaping Use   Vaping status: Never Used  Substance and Sexual Activity   Alcohol  use: Never   Drug use: Never   Sexual activity: Not on file  Other Topics Concern   Not on file  Social History Narrative   Not on file   Social Determinants of Health   Financial Resource Strain: Not on file  Food Insecurity: Not on file  Transportation Needs: Not on file  Physical Activity: Not on file  Stress: Not on file  Social Connections: Not on file  Intimate Partner Violence: Not on file    Review of Systems: See HPI, otherwise negative ROS  Physical Exam: BP (!) 156/94   Pulse 89   Temp (!) 97.4 F (36.3 C) (Temporal)   Resp 18   Ht 5\' 2"  (1.575 m)   Wt 79.9 kg   SpO2 100%   BMI 32.22 kg/m  General:   Alert,  pleasant and cooperative in NAD Head:  Normocephalic and atraumatic. Neck:  Supple; no masses or thyromegaly. Lungs:  Clear throughout to auscultation.    Heart:  Regular rate and rhythm. Abdomen:  Soft, nontender and nondistended. Normal bowel sounds, without guarding, and without rebound.   Neurologic:  Alert and  oriented x4;  grossly normal neurologically.  Impression/Plan: Ariana Potter is here for an colonoscopy to be performed for positive cologaurd  Risks, benefits, limitations, and alternatives  regarding  colonoscopy have been reviewed with the patient.  Questions have been answered.  All parties agreeable.   Lannette Donath, MD  07/13/2023, 7:54 AM

## 2023-07-13 NOTE — Transfer of Care (Signed)
Immediate Anesthesia Transfer of Care Note  Patient: Ariana Potter  Procedure(s) Performed: COLONOSCOPY WITH PROPOFOL  Patient Location: PACU  Anesthesia Type:MAC  Level of Consciousness: drowsy  Airway & Oxygen Therapy: Patient Spontanous Breathing and Patient connected to face mask oxygen  Post-op Assessment: Report given to RN and Post -op Vital signs reviewed and stable  Post vital signs: Reviewed  Last Vitals:  Vitals Value Taken Time  BP 114/75 07/13/23 0901  Temp 61F   Pulse 75 07/13/23 0902  Resp 13 07/13/23 0902  SpO2 97 % 07/13/23 0902  Vitals shown include unfiled device data.  Last Pain:  Vitals:   07/13/23 0741  TempSrc: Temporal  PainSc: 0-No pain         Complications: No notable events documented.

## 2023-07-13 NOTE — Anesthesia Postprocedure Evaluation (Signed)
Anesthesia Post Note  Patient: Ariana Potter  Procedure(s) Performed: COLONOSCOPY WITH PROPOFOL  Patient location during evaluation: Endoscopy Anesthesia Type: General Level of consciousness: awake and alert Pain management: pain level controlled Vital Signs Assessment: post-procedure vital signs reviewed and stable Respiratory status: spontaneous breathing, nonlabored ventilation, respiratory function stable and patient connected to nasal cannula oxygen Cardiovascular status: blood pressure returned to baseline and stable Postop Assessment: no apparent nausea or vomiting Anesthetic complications: no   No notable events documented.   Last Vitals:  Vitals:   07/13/23 0741 07/13/23 0902  BP: (!) 156/94 114/75  Pulse: 89 84  Resp: 18 20  Temp: (!) 36.3 C (!) 36.3 C  SpO2: 100% 97%    Last Pain:  Vitals:   07/13/23 0912  TempSrc:   PainSc: 0-No pain                 Louie Boston

## 2023-07-14 ENCOUNTER — Encounter: Payer: Self-pay | Admitting: Gastroenterology

## 2024-04-14 ENCOUNTER — Encounter: Payer: Self-pay | Admitting: Nurse Practitioner

## 2024-04-14 ENCOUNTER — Other Ambulatory Visit: Payer: Self-pay | Admitting: Nurse Practitioner

## 2024-04-14 DIAGNOSIS — Z1231 Encounter for screening mammogram for malignant neoplasm of breast: Secondary | ICD-10-CM

## 2024-05-24 ENCOUNTER — Ambulatory Visit
Admission: RE | Admit: 2024-05-24 | Discharge: 2024-05-24 | Disposition: A | Discharge status: Home / Self Care | Source: Ambulatory Visit | Attending: Nurse Practitioner | Admitting: Nurse Practitioner

## 2024-05-24 DIAGNOSIS — Z1231 Encounter for screening mammogram for malignant neoplasm of breast: Secondary | ICD-10-CM | POA: Insufficient documentation
